# Patient Record
Sex: Male | Born: 1990 | Race: Black or African American | Hispanic: No | Marital: Single | Smoking: Never smoker
Health system: Southern US, Community
[De-identification: ages and names within clinical notes are randomized; demographics above are authoritative.]

---

## 2017-08-27 ENCOUNTER — Emergency Department: Payer: No Typology Code available for payment source

## 2017-08-27 ENCOUNTER — Emergency Department
Admission: EM | Admit: 2017-08-27 | Discharge: 2017-08-27 | Disposition: A | Discharge status: Home / Self Care | Payer: No Typology Code available for payment source | Attending: Emergency Medicine | Admitting: Emergency Medicine

## 2017-08-27 ENCOUNTER — Encounter: Payer: Self-pay | Admitting: Emergency Medicine

## 2017-08-27 ENCOUNTER — Other Ambulatory Visit: Payer: Self-pay

## 2017-08-27 DIAGNOSIS — S40012A Contusion of left shoulder, initial encounter: Secondary | ICD-10-CM

## 2017-08-27 DIAGNOSIS — S39012A Strain of muscle, fascia and tendon of lower back, initial encounter: Secondary | ICD-10-CM

## 2017-08-27 DIAGNOSIS — Y939 Activity, unspecified: Secondary | ICD-10-CM | POA: Insufficient documentation

## 2017-08-27 DIAGNOSIS — S4992XA Unspecified injury of left shoulder and upper arm, initial encounter: Secondary | ICD-10-CM | POA: Diagnosis present

## 2017-08-27 DIAGNOSIS — Y998 Other external cause status: Secondary | ICD-10-CM | POA: Diagnosis not present

## 2017-08-27 DIAGNOSIS — Y92410 Unspecified street and highway as the place of occurrence of the external cause: Secondary | ICD-10-CM | POA: Insufficient documentation

## 2017-08-27 MED ORDER — METHOCARBAMOL 500 MG PO TABS: 500.0000 mg | ORAL_TABLET | Freq: Four times a day (QID) | ORAL | 0 refills | Status: DC

## 2017-08-27 MED ORDER — MELOXICAM 15 MG PO TABS: 15.0000 mg | ORAL_TABLET | Freq: Every day | ORAL | 0 refills | Status: DC

## 2017-08-27 MED ORDER — HYDROCODONE-ACETAMINOPHEN 5-325 MG PO TABS
1.0000 | ORAL_TABLET | Freq: Once | ORAL | Status: AC
  Administered 2017-08-27: 1 via ORAL
  Filled 2017-08-27: qty 1

## 2017-08-27 MED ORDER — MELOXICAM 7.5 MG PO TABS
15.0000 mg | ORAL_TABLET | Freq: Once | ORAL | Status: AC
  Administered 2017-08-27: 15 mg via ORAL
  Filled 2017-08-27: qty 2

## 2017-08-27 MED ORDER — METHOCARBAMOL 500 MG PO TABS
1000.0000 mg | ORAL_TABLET | Freq: Once | ORAL | Status: AC
  Administered 2017-08-27: 1000 mg via ORAL
  Filled 2017-08-27: qty 2
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
## 2017-08-27 NOTE — ED Notes (Signed)
Patient transported to X-ray 
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
## 2022-06-29 ENCOUNTER — Encounter (HOSPITAL_COMMUNITY): Payer: Self-pay

## 2022-06-29 ENCOUNTER — Other Ambulatory Visit: Payer: Self-pay

## 2022-06-29 ENCOUNTER — Emergency Department (HOSPITAL_COMMUNITY): Payer: No Typology Code available for payment source

## 2022-06-29 ENCOUNTER — Emergency Department (HOSPITAL_COMMUNITY)
Admission: EM | Admit: 2022-06-29 | Discharge: 2022-06-29 | Disposition: A | Discharge status: Home / Self Care | Payer: No Typology Code available for payment source | Attending: Emergency Medicine | Admitting: Emergency Medicine

## 2022-06-29 DIAGNOSIS — Y92410 Unspecified street and highway as the place of occurrence of the external cause: Secondary | ICD-10-CM | POA: Diagnosis not present

## 2022-06-29 DIAGNOSIS — M545 Low back pain, unspecified: Secondary | ICD-10-CM

## 2022-06-29 DIAGNOSIS — R519 Headache, unspecified: Secondary | ICD-10-CM | POA: Diagnosis not present

## 2022-06-29 DIAGNOSIS — M25562 Pain in left knee: Secondary | ICD-10-CM | POA: Diagnosis not present

## 2022-06-29 MED ORDER — KETOROLAC TROMETHAMINE 30 MG/ML IJ SOLN
30.0000 mg | Freq: Once | INTRAMUSCULAR | Status: AC
  Administered 2022-06-29: 30 mg via INTRAMUSCULAR
  Filled 2022-06-29: qty 1

## 2022-06-29 MED ORDER — METHOCARBAMOL 500 MG PO TABS: 500.0000 mg | ORAL_TABLET | Freq: Four times a day (QID) | ORAL | 0 refills | Status: AC

## 2022-06-29 MED ORDER — MELOXICAM 15 MG PO TABS: 15.0000 mg | ORAL_TABLET | Freq: Every day | ORAL | 0 refills | Status: AC
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
